# Patient Record
Sex: Male | Born: 1965 | Race: White | Hispanic: No | Marital: Married
Health system: Southern US, Community
[De-identification: ages and names within clinical notes are randomized; demographics above are authoritative.]

---

## 2015-01-21 ENCOUNTER — Emergency Department: Payer: PRIVATE HEALTH INSURANCE

## 2015-01-21 ENCOUNTER — Emergency Department
Admission: EM | Admit: 2015-01-21 | Discharge: 2015-01-21 | Disposition: A | Discharge status: Home / Self Care | Payer: PRIVATE HEALTH INSURANCE | Attending: Emergency Medicine | Admitting: Emergency Medicine

## 2015-01-21 DIAGNOSIS — S3992XA Unspecified injury of lower back, initial encounter: Secondary | ICD-10-CM | POA: Diagnosis present

## 2015-01-21 DIAGNOSIS — Y9289 Other specified places as the place of occurrence of the external cause: Secondary | ICD-10-CM | POA: Diagnosis not present

## 2015-01-21 DIAGNOSIS — M62830 Muscle spasm of back: Secondary | ICD-10-CM

## 2015-01-21 DIAGNOSIS — Y9389 Activity, other specified: Secondary | ICD-10-CM | POA: Insufficient documentation

## 2015-01-21 DIAGNOSIS — S39012A Strain of muscle, fascia and tendon of lower back, initial encounter: Secondary | ICD-10-CM | POA: Insufficient documentation

## 2015-01-21 DIAGNOSIS — X58XXXA Exposure to other specified factors, initial encounter: Secondary | ICD-10-CM | POA: Diagnosis not present

## 2015-01-21 DIAGNOSIS — Z880 Allergy status to penicillin: Secondary | ICD-10-CM | POA: Insufficient documentation

## 2015-01-21 DIAGNOSIS — Y998 Other external cause status: Secondary | ICD-10-CM | POA: Diagnosis not present

## 2015-01-21 DIAGNOSIS — M5432 Sciatica, left side: Secondary | ICD-10-CM

## 2015-01-21 MED ORDER — PREDNISONE 10 MG PO TABS: ORAL_TABLET | ORAL | Status: AC

## 2015-01-21 MED ORDER — KETOROLAC TROMETHAMINE 30 MG/ML IJ SOLN: 30.0000 mg | Freq: Once | INTRAMUSCULAR | Status: DC

## 2015-01-21 MED ORDER — DIAZEPAM 5 MG PO TABS
5.0000 mg | ORAL_TABLET | Freq: Four times a day (QID) | ORAL | Status: AC | PRN
Start: 2015-01-21 — End: ?

## 2015-01-21 MED ORDER — KETOROLAC TROMETHAMINE 60 MG/2ML IM SOLN
INTRAMUSCULAR | Status: AC
  Administered 2015-01-21: 60 mg via INTRAMUSCULAR
  Filled 2015-01-21: qty 2

## 2015-01-21 MED ORDER — KETOROLAC TROMETHAMINE 60 MG/2ML IM SOLN
60.0000 mg | Freq: Once | INTRAMUSCULAR | Status: AC
  Administered 2015-01-21: 60 mg via INTRAMUSCULAR

## 2015-01-21 NOTE — ED Notes (Signed)
States he bent down earlier today and felt a pop states unable to stand up straight ..describes pain as cramping

## 2015-01-21 NOTE — ED Notes (Signed)
Pt reports to ED with lower back pain. Hx of same. Hx of surgery 2008 lipoma removal from R scapula. 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

## 2015-01-21 NOTE — ED Provider Notes (Signed)
CSN: [id number]     Arrival date & time 01/21/15  1825 History   First MD Initiated Contact with Patient 01/21/15 1840     Chief Complaint  Patient presents with  . Back Pain    HPI Comments: 49 year old male presents today complaining of low back pain secondary to injury that occurred this morning around 11am. Pt reports he was cutting down a tree and then making logs out of the wood. He was lifting something heavy when the pain started. It was sharp and stabbing. He had a similar problem a few years ago with a lifting injury. This caused much more severe pain and numbness down both his legs. He does not complain of leg pain, numbness or tingling down the legs or loss of bowel or bladder function. Took some aleve and ativan without relief.   Patient is a 49 y.o. male presenting with back pain. The history is provided by the patient and the spouse.  Back Pain Location:  Lumbar spine Quality:  Stabbing Radiates to:  Does not radiate Pain severity:  Moderate Pain is:  Same all the time Onset quality:  Sudden Timing:  Constant Progression:  Unchanged Chronicity:  Recurrent Context: lifting heavy objects   Relieved by:  None tried Worsened by:  Ambulation, standing and movement Ineffective treatments:  Bed rest Associated symptoms: no bladder incontinence, no bowel incontinence, no leg pain, no numbness, no paresthesias, no perianal numbness and no weakness   Risk factors: not obese and no recent surgery     No past medical history on file. No past surgical history on file. No family history on file. Social History  Substance Use Topics  . Smoking status: Not on file  . Smokeless tobacco: Not on file  . Alcohol Use: Not on file    Review of Systems  Gastrointestinal: Negative for bowel incontinence.  Genitourinary: Negative for bladder incontinence.  Musculoskeletal: Positive for myalgias, back pain and arthralgias. Negative for gait problem.  Neurological: Negative for  weakness, numbness and paresthesias.  All other systems reviewed and are negative.     Allergies  Penicillins  Home Medications   Prior to Admission medications   Medication Sig Start Date End Date Taking? Authorizing Provider  diazepam (VALIUM) 5 MG tablet Take 1 tablet (5 mg total) by mouth every 6 (six) hours as needed for muscle spasms. 01/21/15   Luvenia Redden, PA-C  predniSONE (DELTASONE) 10 MG tablet Taper: 6,5,4,3,2,1 01/21/15   Wilber Oliphant V, PA-C   BP 137/90 mmHg  Pulse 96  Temp(Src) 98.8 F (37.1 C) (Oral)  Resp 18  Ht  (1.626 m)  Wt 195 lb (88.451 kg)  BMI 33.46 kg/m2  SpO2 95% Physical Exam  Constitutional: He is oriented to person, place, and time. He appears well-developed and well-nourished.  HENT:  Head: Normocephalic and atraumatic.  Musculoskeletal: He exhibits tenderness.       Right hip: Normal. He exhibits normal range of motion, normal strength, no tenderness and no bony tenderness.       Left hip: Normal. He exhibits normal range of motion, normal strength, no tenderness and no bony tenderness.       Lumbar back: He exhibits decreased range of motion, tenderness and bony tenderness.  TTP over L3-L5 and lumbar paraspinal muscle tenderness present.   Neurological: He is alert and oriented to person, place, and time. He has normal strength. No sensory deficit.  Strength 5/5 bilateral LE Equal great toe raise bilaterally +  SLR on the left at 30 degrees   Skin: Skin is warm and dry.  Psychiatric: He has a normal mood and affect. His behavior is normal. Judgment and thought content normal.  Nursing note and vitals reviewed.   ED Course  Procedures (including critical care time) Labs Review Labs Reviewed - No data to display  Imaging Review No results found. I have personally reviewed and evaluated these images and lab results as part of my medical decision-making.   EKG Interpretation None      MDM  Lumbar strain with associated sciatica.  Prednisone course for 6 days, no NSAIDS while taking. Valium as needed for muscle spasm. Alternate heat and ice, gentle stretching, try to do gentle activity. Follow up with PCP if no improvement over the next 3 days  Final diagnoses:  Lumbar strain, initial encounter  Lumbar paraspinal muscle spasm  Sciatica, left       Luvenia Redden, PA-C 01/21/15 1913  Minna Antis, MD 01/21/15 2358

## 2015-01-21 NOTE — ED Notes (Signed)
Took "half of a low dose lorazepam" and 2 Aleve around 2p.

## 2017-01-03 IMAGING — CR DG LUMBAR SPINE 2-3V
3 series · 3 of 3 positions shown · non-contrast
Comparison: None.

CLINICAL DATA: Patient was using a chainsaw and then bending over
nailing in boards in his barn. He felt a pop and than had extreme
pain in his lower back along the midline. He had similar pain to
this 8 yrs ago while nailing bent over, but no pain since then. No
surgeries

EXAM:
LUMBAR SPINE - 2-3 VIEW

[l-spine ap]
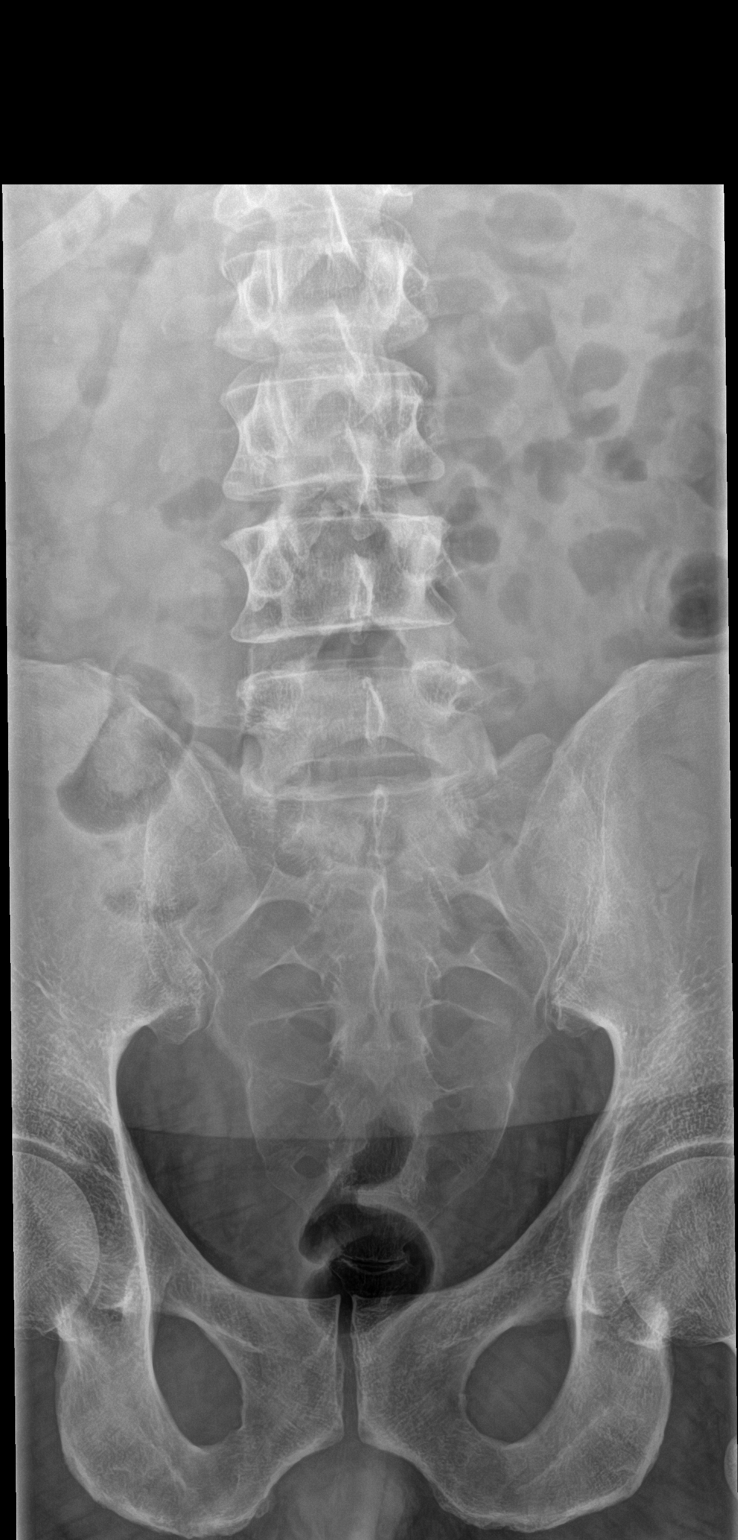

[l-spine lat]
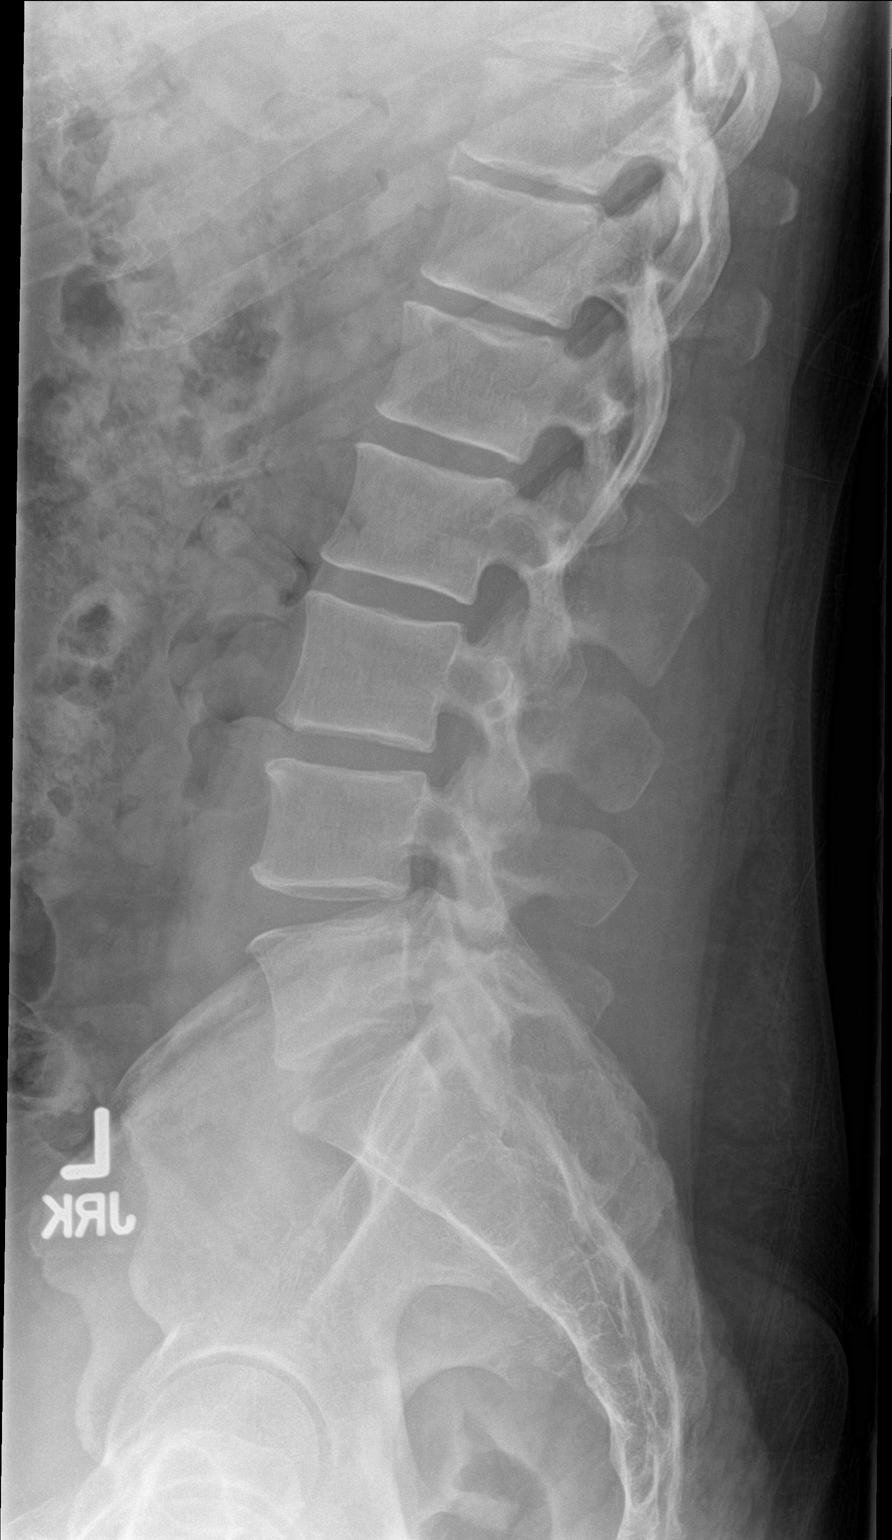

[l-spine spot]
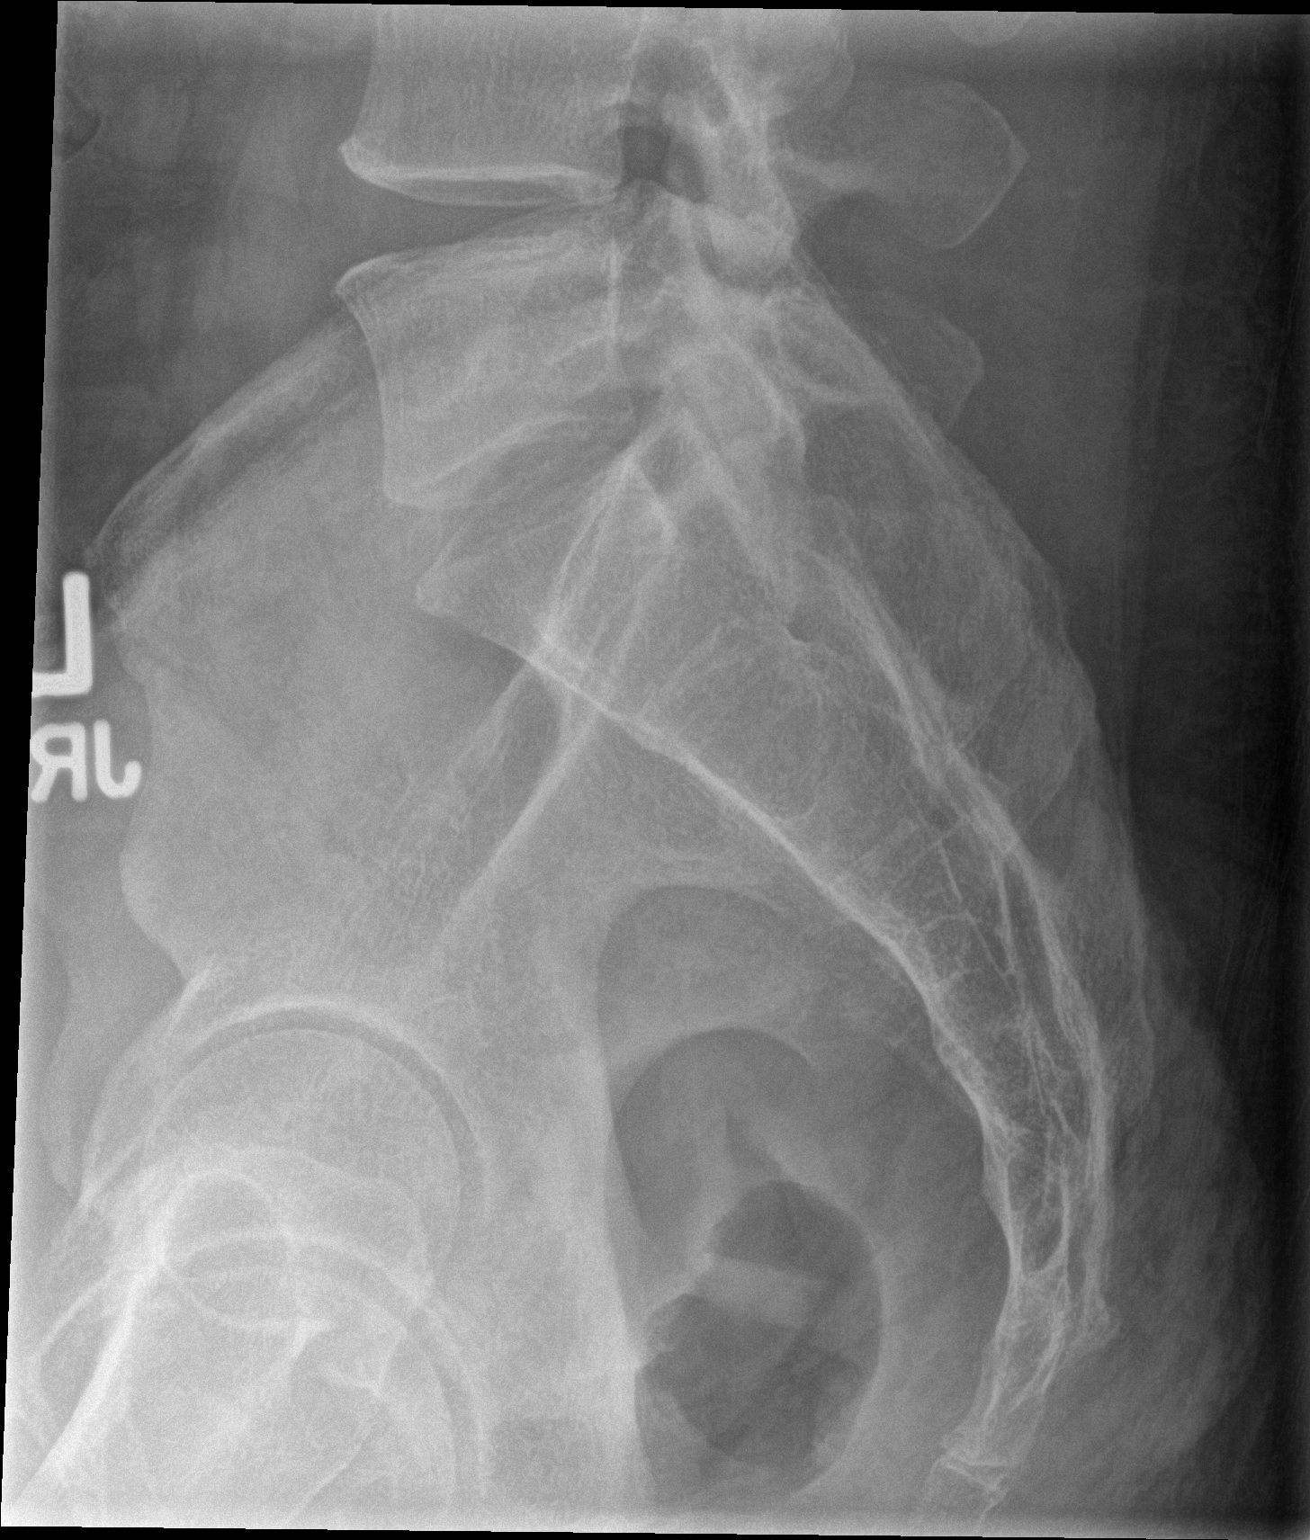

[3 of 3 positions shown; findings below may reference images not displayed]

FINDINGS: No fracture. No spondylolisthesis. Disc spaces are relatively well
preserved. Soft tissues are unremarkable.
IMPRESSION: Negative.
